# Patient Record
Sex: Female | Born: 1980 | Race: White | Hispanic: No | Marital: Married | Smoking: Current every day smoker
Health system: Southern US, Community
[De-identification: ages and names within clinical notes are randomized; demographics above are authoritative.]

---

## 2006-08-01 ENCOUNTER — Emergency Department: Payer: Self-pay | Admitting: Emergency Medicine

## 2006-11-15 ENCOUNTER — Ambulatory Visit: Payer: Self-pay | Admitting: Family Medicine

## 2006-11-21 ENCOUNTER — Ambulatory Visit: Payer: Self-pay | Admitting: Gynecology

## 2006-11-21 ENCOUNTER — Encounter (INDEPENDENT_AMBULATORY_CARE_PROVIDER_SITE_OTHER): Payer: Self-pay | Admitting: Gynecology

## 2006-12-20 ENCOUNTER — Ambulatory Visit: Payer: Self-pay | Admitting: Family Medicine

## 2007-01-19 ENCOUNTER — Ambulatory Visit: Payer: Self-pay | Admitting: Obstetrics & Gynecology

## 2007-01-23 ENCOUNTER — Ambulatory Visit (HOSPITAL_COMMUNITY): Admission: RE | Admit: 2007-01-23 | Discharge: 2007-01-23 | Payer: Self-pay | Admitting: Gynecology

## 2007-02-15 ENCOUNTER — Ambulatory Visit: Payer: Self-pay | Admitting: Obstetrics & Gynecology

## 2007-03-14 ENCOUNTER — Ambulatory Visit: Payer: Self-pay | Admitting: Family Medicine

## 2007-04-03 ENCOUNTER — Ambulatory Visit: Payer: Self-pay | Admitting: Gynecology

## 2007-04-07 ENCOUNTER — Ambulatory Visit: Payer: Self-pay | Admitting: Gynecology

## 2007-04-17 ENCOUNTER — Ambulatory Visit: Payer: Self-pay | Admitting: Gynecology

## 2007-04-19 ENCOUNTER — Ambulatory Visit (HOSPITAL_COMMUNITY): Admission: RE | Admit: 2007-04-19 | Discharge: 2007-04-19 | Payer: Self-pay | Admitting: Family Medicine

## 2007-04-27 ENCOUNTER — Ambulatory Visit (HOSPITAL_COMMUNITY): Admission: RE | Admit: 2007-04-27 | Discharge: 2007-04-27 | Payer: Self-pay | Admitting: Family Medicine

## 2007-05-01 ENCOUNTER — Ambulatory Visit: Payer: Self-pay | Admitting: Gynecology

## 2007-05-04 ENCOUNTER — Ambulatory Visit (HOSPITAL_COMMUNITY): Admission: RE | Admit: 2007-05-04 | Discharge: 2007-05-04 | Payer: Self-pay | Admitting: Family Medicine

## 2007-05-11 ENCOUNTER — Ambulatory Visit: Payer: Self-pay | Admitting: Gynecology

## 2007-05-11 ENCOUNTER — Inpatient Hospital Stay (HOSPITAL_COMMUNITY): Admission: AD | Admit: 2007-05-11 | Discharge: 2007-05-11 | Payer: Self-pay | Admitting: Gynecology

## 2007-05-15 ENCOUNTER — Ambulatory Visit: Payer: Self-pay | Admitting: Gynecology

## 2007-05-18 ENCOUNTER — Ambulatory Visit: Payer: Self-pay | Admitting: Obstetrics and Gynecology

## 2007-05-18 ENCOUNTER — Inpatient Hospital Stay (HOSPITAL_COMMUNITY): Admission: AD | Admit: 2007-05-18 | Discharge: 2007-05-18 | Payer: Self-pay | Admitting: Family Medicine

## 2007-05-22 ENCOUNTER — Ambulatory Visit: Payer: Self-pay | Admitting: Gynecology

## 2007-05-22 ENCOUNTER — Ambulatory Visit (HOSPITAL_COMMUNITY): Admission: RE | Admit: 2007-05-22 | Discharge: 2007-05-22 | Payer: Self-pay | Admitting: Family Medicine

## 2007-05-25 ENCOUNTER — Ambulatory Visit (HOSPITAL_COMMUNITY): Admission: RE | Admit: 2007-05-25 | Discharge: 2007-05-25 | Payer: Self-pay | Admitting: Family Medicine

## 2007-05-26 ENCOUNTER — Ambulatory Visit: Payer: Self-pay | Admitting: Obstetrics and Gynecology

## 2007-05-29 ENCOUNTER — Ambulatory Visit (HOSPITAL_COMMUNITY): Admission: RE | Admit: 2007-05-29 | Discharge: 2007-05-29 | Payer: Self-pay | Admitting: Family Medicine

## 2007-05-29 ENCOUNTER — Ambulatory Visit: Payer: Self-pay | Admitting: Gynecology

## 2007-06-01 ENCOUNTER — Ambulatory Visit: Payer: Self-pay | Admitting: Obstetrics & Gynecology

## 2007-06-01 ENCOUNTER — Ambulatory Visit (HOSPITAL_COMMUNITY): Admission: RE | Admit: 2007-06-01 | Discharge: 2007-06-01 | Payer: Self-pay | Admitting: Family Medicine

## 2007-06-05 ENCOUNTER — Inpatient Hospital Stay (HOSPITAL_COMMUNITY): Admission: AD | Admit: 2007-06-05 | Discharge: 2007-06-09 | Payer: Self-pay | Admitting: Obstetrics & Gynecology

## 2007-06-05 ENCOUNTER — Ambulatory Visit: Payer: Self-pay | Admitting: *Deleted

## 2007-06-16 ENCOUNTER — Ambulatory Visit: Admission: RE | Admit: 2007-06-16 | Discharge: 2007-06-16 | Payer: Self-pay | Admitting: Gynecology

## 2007-07-07 ENCOUNTER — Ambulatory Visit: Admission: RE | Admit: 2007-07-07 | Discharge: 2007-07-07 | Payer: Self-pay | Admitting: Gynecology

## 2007-07-24 ENCOUNTER — Ambulatory Visit: Payer: Self-pay | Admitting: Gynecology

## 2007-07-24 ENCOUNTER — Encounter (INDEPENDENT_AMBULATORY_CARE_PROVIDER_SITE_OTHER): Payer: Self-pay | Admitting: Gynecology

## 2007-11-21 ENCOUNTER — Ambulatory Visit: Payer: Self-pay | Admitting: Obstetrics & Gynecology

## 2011-07-30 LAB — CBC
Hemoglobin: 13.2
MCHC: 36
RBC: 3.57 — ABNORMAL LOW
RDW: 12.8
WBC: 12.3 — ABNORMAL HIGH

## 2011-07-30 LAB — RPR: RPR Ser Ql: NONREACTIVE

## 2011-07-30 LAB — COMPREHENSIVE METABOLIC PANEL
ALT: 11
AST: 16
CO2: 23
Calcium: 8.7
Chloride: 108
Creatinine, Ser: 0.51
GFR calc Af Amer: >60
GFR calc non Af Amer: >60
Glucose, Bld: 83
Sodium: 137
Total Bilirubin: 0.7

## 2011-07-30 LAB — LACTATE DEHYDROGENASE: LDH: 137

## 2011-07-30 LAB — ABO/RH: ABO/RH(D): O NEG

## 2011-09-28 ENCOUNTER — Ambulatory Visit: Payer: Self-pay

## 2013-04-24 ENCOUNTER — Ambulatory Visit: Payer: Self-pay

## 2014-05-27 ENCOUNTER — Ambulatory Visit (INDEPENDENT_AMBULATORY_CARE_PROVIDER_SITE_OTHER): Payer: BC Managed Care – PPO | Admitting: Podiatry

## 2014-05-27 ENCOUNTER — Encounter: Payer: Self-pay | Admitting: Podiatry

## 2014-05-27 ENCOUNTER — Ambulatory Visit (INDEPENDENT_AMBULATORY_CARE_PROVIDER_SITE_OTHER): Payer: BC Managed Care – PPO

## 2014-05-27 VITALS — BP 150/104 | HR 84 | Resp 16 | Ht 67.0 in | Wt 195.0 lb

## 2014-05-27 DIAGNOSIS — M722 Plantar fascial fibromatosis: Secondary | ICD-10-CM

## 2014-05-27 MED ORDER — METHYLPREDNISOLONE (PAK) 4 MG PO TABS: ORAL_TABLET | ORAL | Status: AC

## 2014-05-27 MED ORDER — MELOXICAM 15 MG PO TABS: 15.0000 mg | ORAL_TABLET | Freq: Every day | ORAL | Status: AC

## 2014-05-27 NOTE — Progress Notes (Signed)
   Subjective:    Patient ID: Kimberly Mcdaniel, female    DOB: 1981-10-11, 33 y.o.   MRN: [id number]  HPI Comments: i have left heel pain. Ive had it 2 months. Its gotten worse. It hurts when i walk, am is bad, and sitting. Ive done heat, cold, massage, stretching and that's it. i think i have a wart on my 2nd toe on my rt foot. Ive had it for yrs. Stepping on it wrong will hurt. i dig it out myself.  Foot Pain Associated symptoms include coughing.      Review of Systems  Respiratory: Positive for cough, shortness of breath and wheezing.   Musculoskeletal:       Joint pain   Hematological: Bruises/bleeds easily.  All other systems reviewed and are negative.      Objective:   Physical Exam I have reviewed her past medical history medications allergies surgeries social history and review systems. Pulses are strongly palpable bilateral. Neurologic sensorium is intact per Semmes-Weinstein monofilament. He can reflexes are intact bilateral muscle strength is 5 over 5 dorsiflexors plantar flexors inverters everters all intrinsic musculature is intact. Orthopedic evaluation demonstrates cavus foot deformity bilateral with hammertoe deformities bilateral. She has pain on palpation medial continued tubercle of the left heel. Radiographic evaluation does demonstrate soft tissue increase in density at the plantar fascial continue insertion site of the left heel with a very small plantar distally oriented calcaneal heel spur demonstrated on radiograph.        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  Plan: Discussed etiology pathology conservative or surgical therapies. She states that she is allergic to ibuprofen but we are going to start her on a Medrol Dosepak to be followed by meloxicam. She she started to develop a rash or itching she will discontinue the meloxicam immediately. I also injected her left heel today with Kenalog and local  anesthetic. Fascial brace was applied and she was dispensed a night splint. We discussed appropriate shoe gear stretching exercises ice therapy shoe gear modifications and I will followup with her in one month.

## 2014-06-17 ENCOUNTER — Ambulatory Visit: Payer: BC Managed Care – PPO | Admitting: Podiatry

## 2014-10-21 ENCOUNTER — Ambulatory Visit: Payer: Self-pay | Admitting: Physician Assistant

## 2014-10-21 LAB — CBC WITH DIFFERENTIAL/PLATELET
BASOS PCT: 0.6 %
Basophil #: 0.1 10*3/uL (ref 0.0–0.1)
EOS PCT: 0.6 %
Eosinophil #: 0.1 10*3/uL (ref 0.0–0.7)
HCT: 49.2 % — AB (ref 35.0–47.0)
HGB: 16.4 g/dL — AB (ref 12.0–16.0)
Lymphocyte #: 1.6 10*3/uL (ref 1.0–3.6)
Lymphocyte %: 13.3 %
MCH: 31.9 pg (ref 26.0–34.0)
MCHC: 33.4 g/dL (ref 32.0–36.0)
MCV: 95 fL (ref 80–100)
MONOS PCT: 4.2 %
Monocyte #: 0.5 x10 3/mm (ref 0.2–0.9)
NEUTROS ABS: 9.6 10*3/uL — AB (ref 1.4–6.5)
Neutrophil %: 81.3 %
Platelet: 256 10*3/uL (ref 150–440)
RBC: 5.15 10*6/uL (ref 3.80–5.20)
RDW: 12.8 % (ref 11.5–14.5)
WBC: 11.8 10*3/uL — AB (ref 3.6–11.0)

## 2014-10-21 LAB — TSH: THYROID STIMULATING HORM: 1.03 u[IU]/mL

## 2014-10-22 ENCOUNTER — Ambulatory Visit: Payer: Self-pay | Admitting: Physician Assistant

## 2014-11-15 ENCOUNTER — Ambulatory Visit: Payer: Self-pay | Admitting: Otolaryngology

## 2015-01-31 ENCOUNTER — Other Ambulatory Visit: Payer: Self-pay | Admitting: Otolaryngology

## 2015-01-31 DIAGNOSIS — E041 Nontoxic single thyroid nodule: Secondary | ICD-10-CM

## 2015-05-27 ENCOUNTER — Ambulatory Visit
Admission: RE | Admit: 2015-05-27 | Discharge: 2015-05-27 | Disposition: A | Discharge status: Home / Self Care | Payer: Self-pay | Source: Ambulatory Visit | Attending: Otolaryngology | Admitting: Otolaryngology

## 2015-05-27 DIAGNOSIS — E041 Nontoxic single thyroid nodule: Secondary | ICD-10-CM | POA: Insufficient documentation

## 2015-06-03 ENCOUNTER — Other Ambulatory Visit: Payer: Self-pay | Admitting: Otolaryngology

## 2015-06-03 DIAGNOSIS — E041 Nontoxic single thyroid nodule: Secondary | ICD-10-CM

## 2015-11-25 ENCOUNTER — Ambulatory Visit
Admission: RE | Admit: 2015-11-25 | Discharge: 2015-11-25 | Disposition: A | Discharge status: Home / Self Care | Payer: BLUE CROSS/BLUE SHIELD | Source: Ambulatory Visit | Attending: Otolaryngology | Admitting: Otolaryngology

## 2015-11-25 DIAGNOSIS — E041 Nontoxic single thyroid nodule: Secondary | ICD-10-CM

## 2015-11-25 DIAGNOSIS — E042 Nontoxic multinodular goiter: Secondary | ICD-10-CM | POA: Diagnosis not present

## 2016-02-24 IMAGING — US US SOFT TISSUE HEAD/NECK
1 series · 13 of 25 positions shown · non-contrast
Comparison: 11/15/2014, 10/22/2014

CLINICAL DATA: 34-year-old female with bilateral thyroid nodules.

EXAM:
THYROID ULTRASOUND
TECHNIQUE: Ultrasound examination of the thyroid gland and adjacent soft
tissues was performed.

[Series 1: us soft tissue head/neck · 0.08mm/px · 13 of 85 slices shown]
[im 1/85]
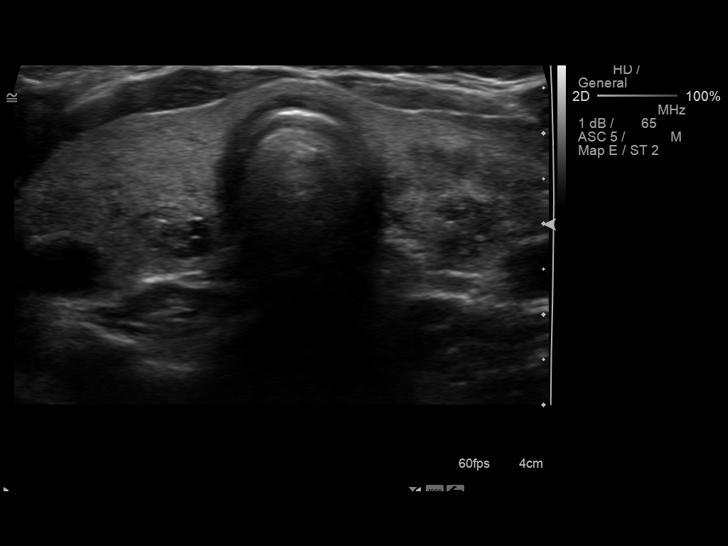
[im 8/85]
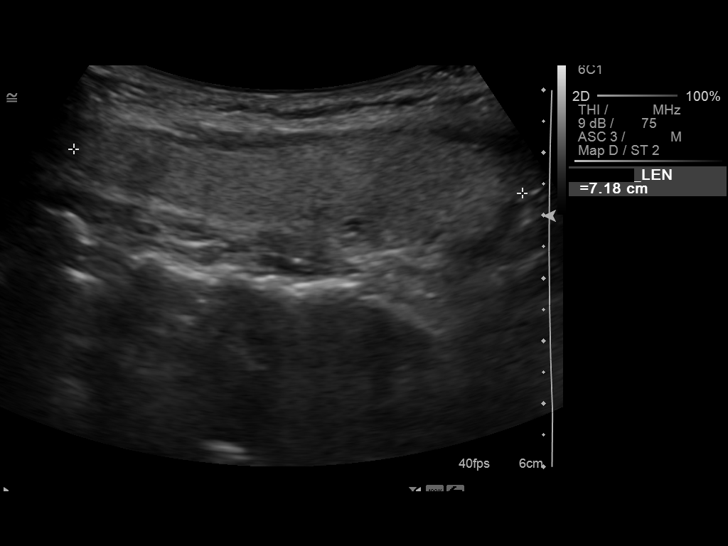
[im 15/85]
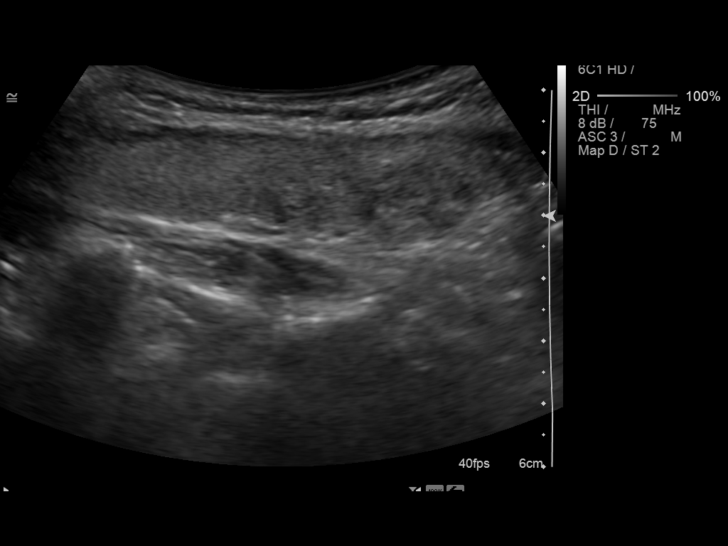
[im 22/85]
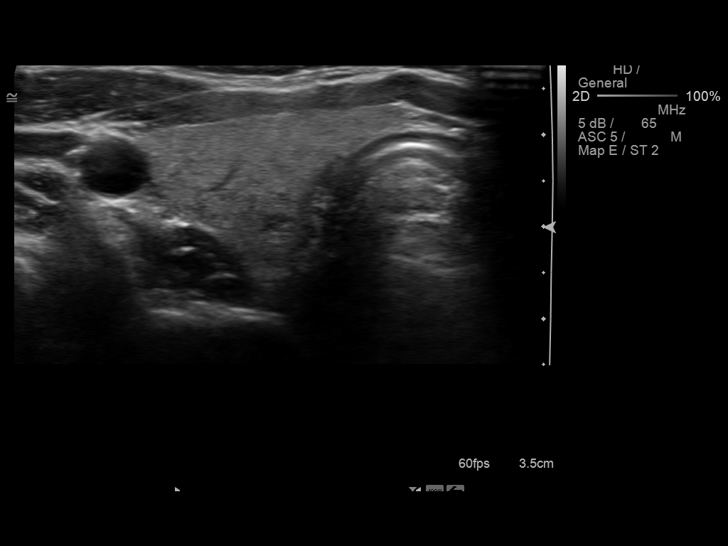
[im 29/85]
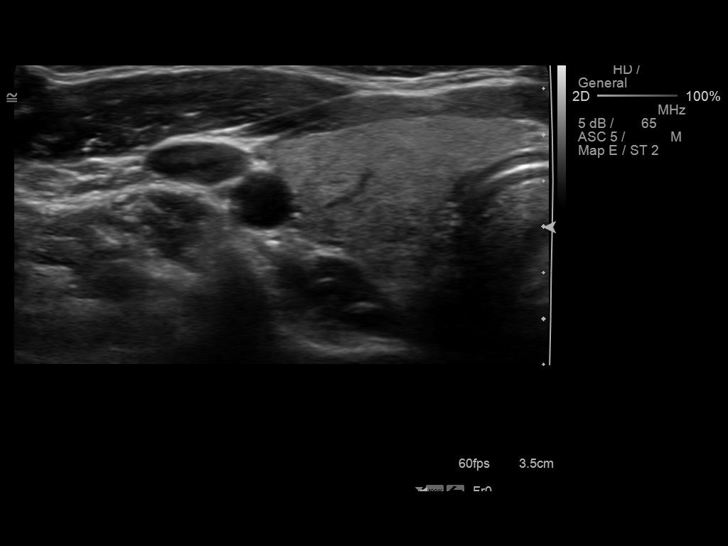
[im 36/85]
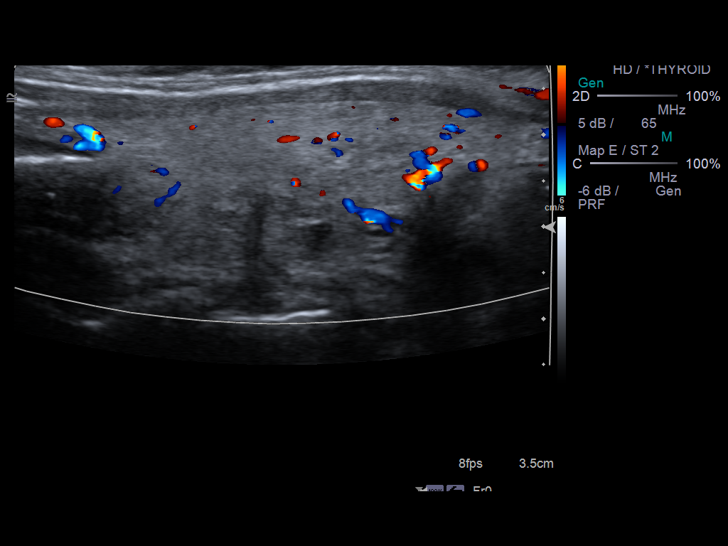
[im 43/85]
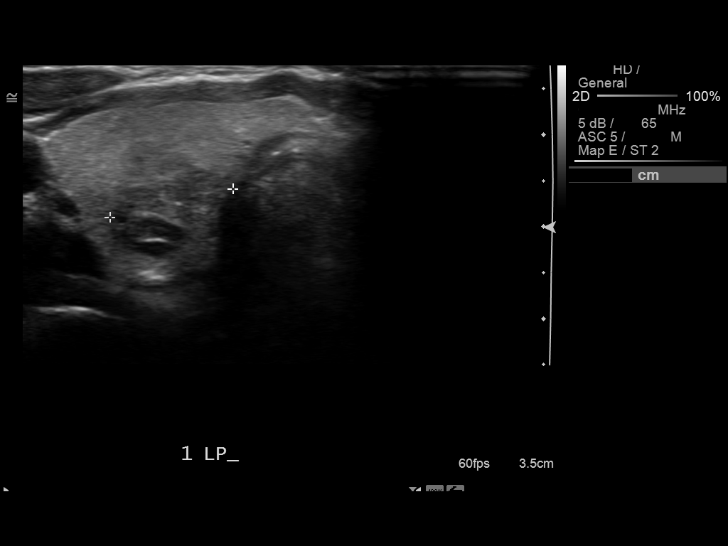
[im 50/85]
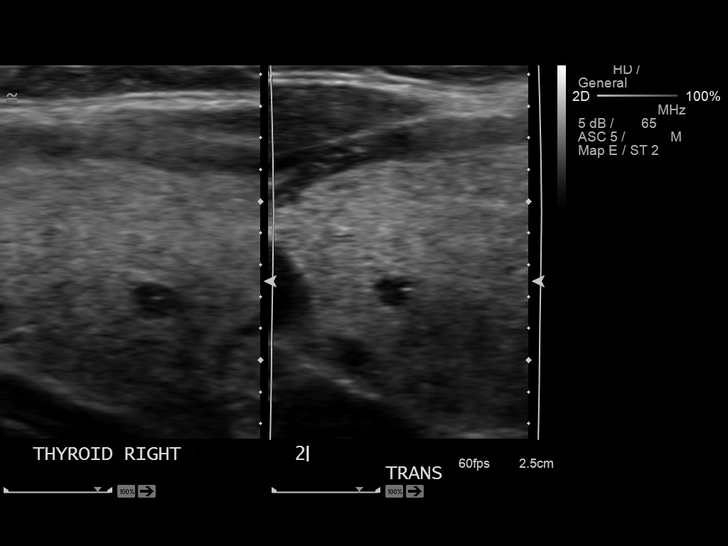
[im 57/85]
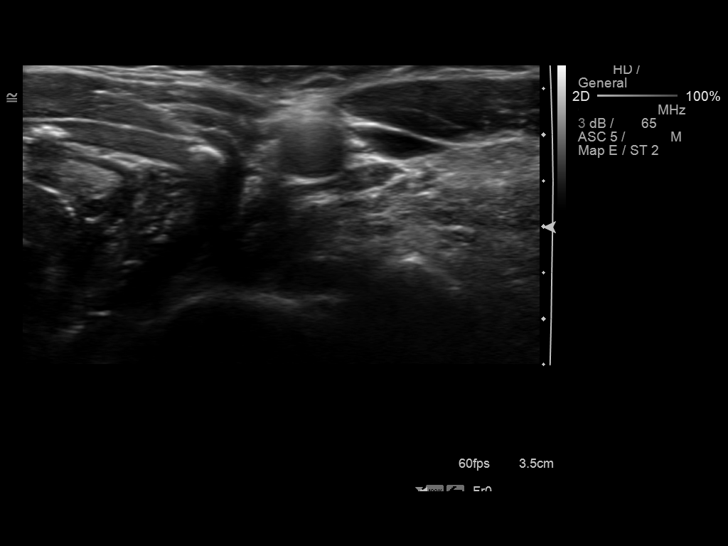
[im 64/85]
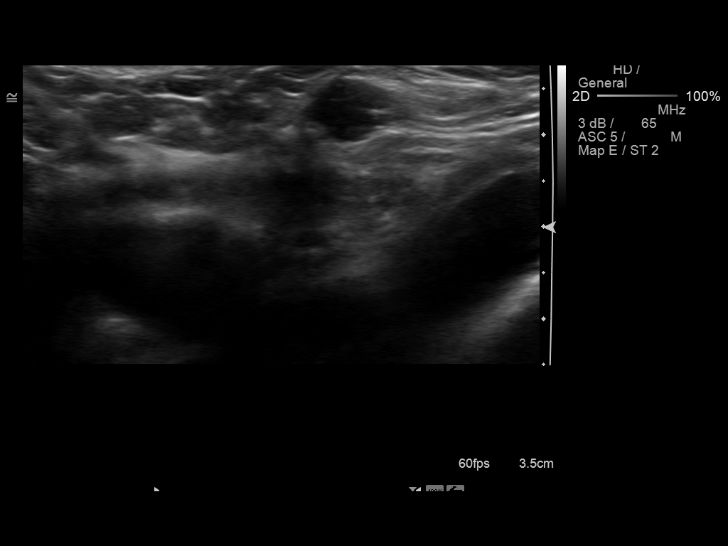
[im 71/85]
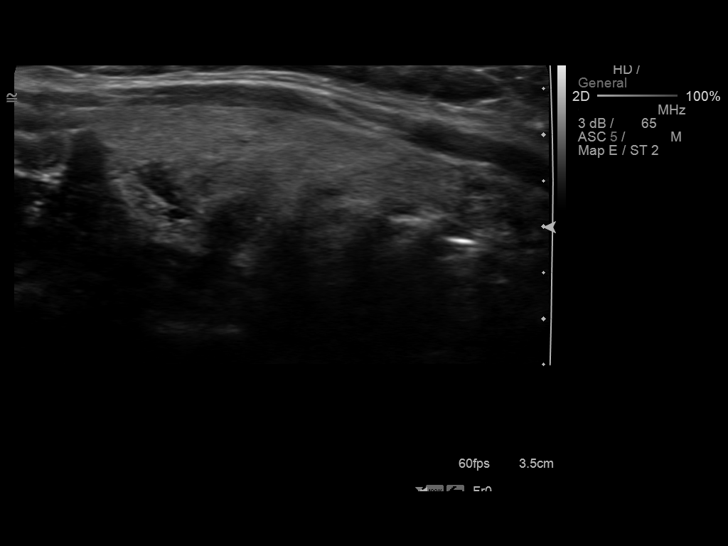
[im 78/85]
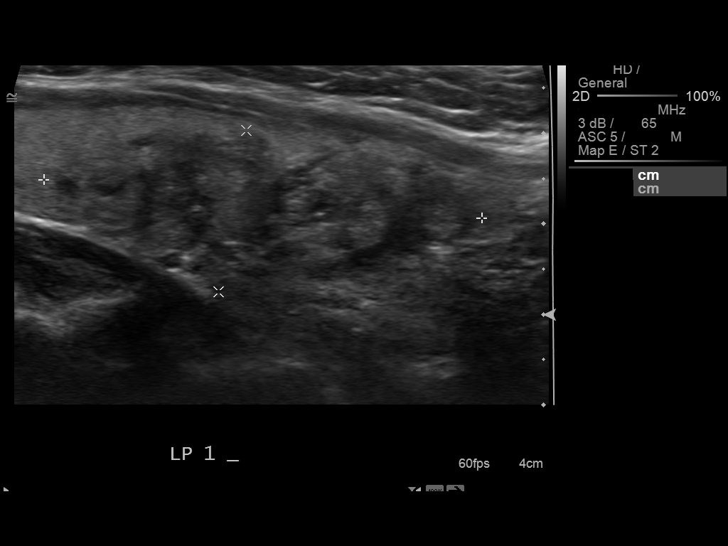
[im 85/85]
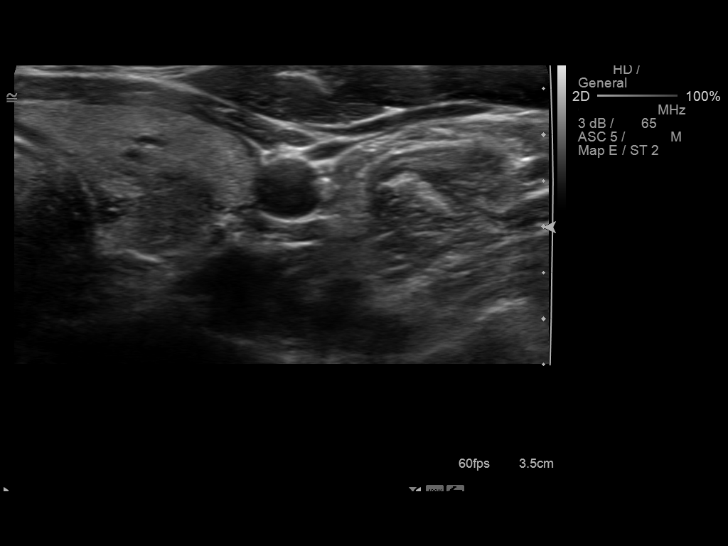

[13 of 25 positions shown; findings below may reference images not displayed]

FINDINGS: Right thyroid lobe

Measurements: 7.2 cm x 1.8 cm x 2.4 cm. Several nodular regions of
the right thyroid identified, majority of which measure less than 7
mm.

At the inferior right thyroid there appears to be 2 adjacent nodules
within indistinct border between them. Longitudinal image
demonstrates greatest diameter of the more inferior measures 9 mm.
The more superior region is not as well defined.

Left thyroid lobe

Measurements: 7.5 cm x 1.9 cm x 2.0 cm. Dominant left-sided nodule
measures 4.9 cm x 1.8 cm x 2.0 cm. This has been previously
biopsied. Additional small left-sided nodule measures 3 mm.

Isthmus

Thickness: 3 mm.  No nodules visualized.

Lymphadenopathy

None visualized.
IMPRESSION: Dominant left-sided nodule has been biopsied previously. Recommend
correlation with prior biopsy results.

Right-sided nodules do not meet criteria for biopsy by size. Region
at the inferior right thyroid appears to represent 2 separate
nodules/pseudo nodules, with the larger measuring only 9 mm, and not
meeting criteria for biopsy.

Follow-up by clinical exam is recommended. If patient has known risk
factors for thyroid carcinoma, consider follow-up ultrasound in 12
months. If patient is clinically hyperthyroid, consider nuclear
medicine thyroid uptake and scan.Reference: Management of Thyroid
Nodules Detected at US: [employer] in Ultrasound
